# Patient Record
Sex: Male | Born: 1960 | Race: Black or African American | Hispanic: No | Marital: Single | Smoking: Never smoker
Health system: Southern US, Community
[De-identification: ages and names within clinical notes are randomized; demographics above are authoritative.]

## PROBLEM LIST (undated history)

## (undated) DIAGNOSIS — R609 Edema, unspecified: Secondary | ICD-10-CM

## (undated) DIAGNOSIS — I82409 Acute embolism and thrombosis of unspecified deep veins of unspecified lower extremity: Secondary | ICD-10-CM

## (undated) HISTORY — DX: Edema, unspecified: R60.9

## (undated) HISTORY — DX: Acute embolism and thrombosis of unspecified deep veins of unspecified lower extremity: I82.409

---

## 2009-05-10 ENCOUNTER — Ambulatory Visit: Payer: Self-pay | Admitting: Neurology

## 2012-12-28 ENCOUNTER — Encounter (INDEPENDENT_AMBULATORY_CARE_PROVIDER_SITE_OTHER): Payer: BC Managed Care – PPO

## 2012-12-28 DIAGNOSIS — R42 Dizziness and giddiness: Secondary | ICD-10-CM

## 2013-05-23 DIAGNOSIS — M542 Cervicalgia: Secondary | ICD-10-CM | POA: Insufficient documentation

## 2014-01-13 ENCOUNTER — Ambulatory Visit: Payer: Self-pay | Admitting: Gastroenterology

## 2015-08-14 ENCOUNTER — Ambulatory Visit
Admission: RE | Admit: 2015-08-14 | Discharge: 2015-08-14 | Disposition: A | Discharge status: Home / Self Care | Payer: BC Managed Care – PPO | Source: Ambulatory Visit | Attending: Internal Medicine | Admitting: Internal Medicine

## 2015-08-14 ENCOUNTER — Other Ambulatory Visit: Payer: Self-pay | Admitting: Internal Medicine

## 2015-08-14 DIAGNOSIS — M7732 Calcaneal spur, left foot: Secondary | ICD-10-CM | POA: Insufficient documentation

## 2015-08-14 DIAGNOSIS — M79672 Pain in left foot: Secondary | ICD-10-CM | POA: Diagnosis present

## 2016-06-06 DIAGNOSIS — R200 Anesthesia of skin: Secondary | ICD-10-CM | POA: Insufficient documentation

## 2016-06-29 ENCOUNTER — Other Ambulatory Visit: Payer: Self-pay | Admitting: Neurology

## 2016-06-29 DIAGNOSIS — I729 Aneurysm of unspecified site: Secondary | ICD-10-CM | POA: Insufficient documentation

## 2016-06-29 DIAGNOSIS — R4689 Other symptoms and signs involving appearance and behavior: Secondary | ICD-10-CM | POA: Insufficient documentation

## 2016-07-12 ENCOUNTER — Ambulatory Visit
Admission: RE | Admit: 2016-07-12 | Discharge: 2016-07-12 | Disposition: A | Discharge status: Home / Self Care | Payer: BC Managed Care – PPO | Source: Ambulatory Visit | Attending: Neurology | Admitting: Neurology

## 2016-07-12 DIAGNOSIS — R4781 Slurred speech: Secondary | ICD-10-CM | POA: Insufficient documentation

## 2016-07-12 DIAGNOSIS — R202 Paresthesia of skin: Secondary | ICD-10-CM | POA: Insufficient documentation

## 2016-07-12 DIAGNOSIS — I729 Aneurysm of unspecified site: Secondary | ICD-10-CM

## 2017-04-10 ENCOUNTER — Other Ambulatory Visit: Payer: Self-pay

## 2017-04-10 ENCOUNTER — Encounter: Payer: Self-pay | Admitting: *Deleted

## 2017-04-10 ENCOUNTER — Ambulatory Visit
Admission: EM | Admit: 2017-04-10 | Discharge: 2017-04-10 | Disposition: A | Discharge status: Home / Self Care | Payer: BC Managed Care – PPO | Attending: Family Medicine | Admitting: Family Medicine

## 2017-04-10 DIAGNOSIS — L723 Sebaceous cyst: Secondary | ICD-10-CM | POA: Diagnosis not present

## 2017-04-10 MED ORDER — SULFAMETHOXAZOLE-TRIMETHOPRIM 800-160 MG PO TABS: 1.0000 | ORAL_TABLET | Freq: Two times a day (BID) | ORAL | 0 refills | Status: DC

## 2017-04-10 NOTE — ED Triage Notes (Signed)
PAtient started having symptom of abscess on back 1 week ago.

## 2017-04-10 NOTE — ED Provider Notes (Signed)
[facility]    CSN: [id number] Arrival date & time: 04/10/17  1451     History   Chief Complaint Chief Complaint  Patient presents with  . Abscess    HPI Luke Caldwell is a 57 y.o. male.   The history is provided by the patient.  Abscess  Location:  Torso Torso abscess location:  Upper back Size:  2cm Abscess quality: draining, painful (mild), redness (mild) and warmth   Abscess quality: no fluctuance and no itching   Red streaking: no   Duration:  1 week Progression:  Unchanged Pain details:    Quality:  Throbbing   Severity:  Mild   Timing:  Constant   Progression:  Worsening Chronicity:  New Context: not diabetes, not immunosuppression, not injected drug use, not insect bite/sting and not skin injury   Relieved by:  None tried Associated symptoms: fatigue   Associated symptoms: no anorexia, no fever, no headaches, no nausea and no vomiting   Risk factors: prior abscess   Risk factors: no family hx of MRSA and no hx of MRSA     History reviewed. No pertinent past medical history.  There are no active problems to display for this patient.   History reviewed. No pertinent surgical history.     Home Medications    Prior to Admission medications   Medication Sig Start Date End Date Taking? Authorizing Provider  sulfamethoxazole-trimethoprim (BACTRIM DS,SEPTRA DS) 800-160 MG tablet Take 1 tablet by mouth 2 (two) times daily. 04/10/17   Payton Mccallumonty, Ajene Carchi, MD    Family History Family History  Problem Relation Age of Onset  . Cancer Father     Social History Social History   Tobacco Use  . Smoking status: Never Smoker  . Smokeless tobacco: Never Used  Substance Use Topics  . Alcohol use: Yes  . Drug use: No     Allergies   Patient has no known allergies.   Review of Systems Review of Systems  Constitutional: Positive for fatigue. Negative for fever.  Gastrointestinal: Negative for anorexia, nausea and vomiting.  Neurological:  Negative for headaches.     Physical Exam Triage Vital Signs ED Triage Vitals  Enc Vitals Group     BP 04/10/17 1540 (!) 143/91     Pulse Rate 04/10/17 1540 64     Resp 04/10/17 1540 16     Temp 04/10/17 1540 98.3 F (36.8 C)     Temp Source 04/10/17 1540 Oral     SpO2 04/10/17 1540 99 %     Weight 04/10/17 1542 205 lb (93 kg)     Height 04/10/17 1542 5\' 10"  (1.778 m)     Head Circumference --      Peak Flow --      Pain Score 04/10/17 1542 0     Pain Loc --      Pain Edu? --      Excl. in GC? --    No data found.  Updated Vital Signs BP (!) 143/91 (BP Location: Left Arm)   Pulse 64   Temp 98.3 F (36.8 C) (Oral)   Resp 16   Ht 5\' 10"  (1.778 m)   Wt 205 lb (93 kg)   SpO2 99%   BMI 29.41 kg/m   Visual Acuity Right Eye Distance:   Left Eye Distance:   Bilateral Distance:    Right Eye Near:   Left Eye Near:    Bilateral Near:     Physical Exam  Constitutional: He  appears well-developed and well-nourished. No distress.  Skin: He is not diaphoretic.  2-3cm subcutaneous, mildly tender and erythematous soft mass with slight purelnt drainage draining  Nursing note and vitals reviewed.    UC Treatments / Results  Labs (all labs ordered are listed, but only abnormal results are displayed) Labs Reviewed - No data to display  EKG  EKG Interpretation None       Radiology No results found.  Procedures Procedures (including critical care time)  Medications Ordered in UC Medications - No data to display   Initial Impression / Assessment and Plan / UC Course  I have reviewed the triage vital signs and the nursing notes.  Pertinent labs & imaging results that were available during my care of the patient were reviewed by me and considered in my medical decision making (see chart for details).       Final Clinical Impressions(s) / UC Diagnoses   Final diagnoses:  Sebaceous cyst    ED Discharge Orders        Ordered     sulfamethoxazole-trimethoprim (BACTRIM DS,SEPTRA DS) 800-160 MG tablet  2 times daily     04/10/17 1552     1. diagnosis reviewed with patient 2. rx as per orders above; reviewed possible side effects, interactions, risks and benefits  3. Recommend supportive treatment with warm compresses to area 4. F/u with dermatologist for permanent treatment 5. Follow-up prn if symptoms worsen or don't improve   Controlled Substance Prescriptions [place] Controlled Substance Registry consulted? Not Applicable   Payton Mccallum, MD 04/10/17 [phone]

## 2017-08-15 ENCOUNTER — Ambulatory Visit
Admission: RE | Admit: 2017-08-15 | Discharge: 2017-08-15 | Disposition: A | Discharge status: Home / Self Care | Payer: BC Managed Care – PPO | Source: Ambulatory Visit | Attending: Internal Medicine | Admitting: Internal Medicine

## 2017-08-15 ENCOUNTER — Other Ambulatory Visit: Payer: Self-pay | Admitting: Internal Medicine

## 2017-08-15 DIAGNOSIS — I824Z2 Acute embolism and thrombosis of unspecified deep veins of left distal lower extremity: Secondary | ICD-10-CM | POA: Diagnosis not present

## 2017-08-15 DIAGNOSIS — M79604 Pain in right leg: Secondary | ICD-10-CM | POA: Insufficient documentation

## 2017-08-15 DIAGNOSIS — R609 Edema, unspecified: Secondary | ICD-10-CM | POA: Diagnosis present

## 2017-08-25 ENCOUNTER — Ambulatory Visit (INDEPENDENT_AMBULATORY_CARE_PROVIDER_SITE_OTHER): Payer: BC Managed Care – PPO | Admitting: Vascular Surgery

## 2017-08-25 ENCOUNTER — Encounter (INDEPENDENT_AMBULATORY_CARE_PROVIDER_SITE_OTHER): Payer: Self-pay | Admitting: Vascular Surgery

## 2017-08-25 VITALS — BP 116/78 | HR 55 | Resp 13 | Ht 70.0 in | Wt 222.0 lb

## 2017-08-25 DIAGNOSIS — R609 Edema, unspecified: Secondary | ICD-10-CM

## 2017-08-25 DIAGNOSIS — I82409 Acute embolism and thrombosis of unspecified deep veins of unspecified lower extremity: Secondary | ICD-10-CM | POA: Insufficient documentation

## 2017-08-25 DIAGNOSIS — I82441 Acute embolism and thrombosis of right tibial vein: Secondary | ICD-10-CM

## 2017-08-25 NOTE — Patient Instructions (Signed)
Deep Vein Thrombosis Deep vein thrombosis (DVT) is a condition in which a blood clot forms in a deep vein, such as a lower leg, thigh, or arm vein. A clot is blood that has thickened into a gel or solid. This condition is dangerous. It can lead to serious and even life-threatening complications if the clot travels to the lungs and causes a blockage (pulmonary embolism). It can also damage veins in the leg. This can result in leg pain, swelling, discoloration, and sores (post-thrombotic syndrome). What are the causes? This condition may be caused by:  A slowdown of blood flow.  Damage to a vein.  A condition that makes blood clot more easily.  What increases the risk? The following factors may make you more likely to develop this condition:  Being overweight.  Being elderly, especially over age 60.  Sitting or lying down for more than four hours.  Lack of physical activity (sedentary lifestyle).  Being pregnant, giving birth, or having recently given birth.  Taking medicines that contain estrogen.  Smoking.  A history of any of the following: ? Blood clots or blood clotting disease. ? Peripheral vascular disease. ? Inflammatory bowel disease. ? Cancer. ? Heart disease. ? Genetic conditions that affect how blood clots. ? Neurological diseases that affect the legs (leg paresis). ? Injury. ? Major or lengthy surgery. ? A central line placed inside a large vein.  What are the signs or symptoms? Symptoms of this condition include:  Swelling, pain, or tenderness in an arm or leg.  Warmth, redness, or discoloration in an arm or leg.  If the clot is in your leg, symptoms may be more noticeable or worse when you stand or walk. Some people do not have any symptoms. How is this diagnosed? This condition is diagnosed with:  A medical history.  A physical exam.  Tests, such as: ? Blood tests. These are done to see how your blood clots. ? Imaging tests. These are done to  check for clots. Tests may include:  Ultrasound.  CT scan.  MRI.  X-ray.  Venogram. For this test, X-rays are taken after a dye is injected into a vein.  How is this treated? Treatment for this condition depends on the cause, your risk for bleeding or developing more clots, and any medical conditions you have. Treatment may include:  Taking blood thinners (also called anticoagulants). These medicines may be taken by mouth, injected under the skin, or injected through an IV tube (catheter). These medicines prevent clots from forming.  Injecting medicine that dissolves blood clots into the affected vein (catheter-directed thrombolysis).  Having surgery. Surgery may be done to: ? Remove the clot. ? Place a filter in a large vein to catch blood clots before they reach the lungs.  Some treatments may be continued for up to six months. Follow these instructions at home: If you are taking an oral blood thinner:  Take the medicine exactly as told by your health care provider. Some blood thinners need to be taken at the same time every day. Do not skip a dose.  Ask your health care provider about what foods and drugs interact with the medicine.  Ask about possible side effects. General instructions  Blood thinners can cause easy bruising and difficulty stopping bleeding. Because of this, if you are taking or were given a blood thinner: ? Hold pressure over cuts for longer than usual. ? Tell your dentist and other health care providers that you are taking blood thinners before   having any procedures that can cause bleeding. ? Avoid contact sports.  Take over-the-counter and prescription medicines only as told by your health care provider.  Return to your normal activities as told by your health care provider. Ask your health care provider what activities are safe for you.  Wear compression stockings if recommended by your health care provider.  Keep all follow-up visits as told by  your health care provider. This is important. How is this prevented? To lower your risk of developing this condition again:  For 30 or more minutes every day, do an activity that: ? Involves moving your arms and legs. ? Increases your heart rate.  When traveling for longer than four hours: ? Exercise your arms and legs every hour. ? Drink plenty of water. ? Avoid drinking alcohol.  Avoid sitting or lying for a long time without moving your legs.  Stay a healthy weight.  If you are a woman who is older than age 35, avoid unnecessary use of medicines that contain estrogen.  Do not use any products that contain nicotine or tobacco, such as cigarettes and e-cigarettes. This is especially important if you take estrogen medicines. If you need help quitting, ask your health care provider.  Contact a health care provider if:  You miss a dose of your blood thinner.  You have nausea, vomiting, or diarrhea that lasts for more than one day.  Your menstrual period is heavier than usual.  You have unusual bruising. Get help right away if:  You have new or increased pain, swelling, or redness in an arm or leg.  You have numbness or tingling in an arm or leg.  You have shortness of breath.  You have chest pain.  You have a rapid or irregular heartbeat.  You feel light-headed or dizzy.  You cough up blood.  There is blood in your vomit, stool, or urine.  You have a serious fall or accident, or you hit your head.  You have a severe headache or confusion.  You have a cut that will not stop bleeding. These symptoms may represent a serious problem that is an emergency. Do not wait to see if the symptoms will go away. Get medical help right away. Call your local emergency services (911 in the [place] Do not drive yourself to the hospital. Summary  DVT is a condition in which a blood clot forms in a deep vein, such as a lower leg, thigh, or arm vein.  Symptoms can include swelling,  warmth, pain, and redness in your leg or arm.  Treatment may include taking blood thinners, injecting medicine that dissolves blood clots,wearing compression stockings, or surgery.  If you are prescribed blood thinners, take them exactly as told. This information is not intended to replace advice given to you by your health care provider. Make sure you discuss any questions you have with your health care provider. Document Released: 01/31/2005 Document Revised: 03/05/2016 Document Reviewed: 03/05/2016 Elsevier Interactive Patient Education  2018 [employer]  

## 2017-08-25 NOTE — Assessment & Plan Note (Signed)
The patient has an acute right calf vein DVT.  We had a good discussion today about the pathophysiology and natural history of DVT.  Given that this is in the tibial vein, it does not require full anticoagulation at this time.  His symptoms have improved some with aspirin therapy.  This will need a follow-up study to evaluate for progression.  I will plan on getting an ultrasound done sometime next week.  We can contact the patient with the results of the ultrasound.  If there has not been progression, 3 months of aspirin would be recommended.  If there has been progression, he may need to be switched to full anticoagulation.

## 2017-08-25 NOTE — Assessment & Plan Note (Signed)
Likely from his DVT.  Compression and elevations as well as ASA therapy

## 2017-08-25 NOTE — Progress Notes (Signed)
Patient ID: Luke Caldwell, male   DOB: August 13, 1960, 57 y.o.   MRN: [id number]  Chief Complaint  Patient presents with  . New Patient (Initial Visit)    Edema and DVT    HPI Luke Caldwell is a 57 y.o. male.  I am asked to see the patient by Dr. Dario GuardianJadali for evaluation of DVT.  The patient reports a couple of weeks of pain and swelling in the right lower extremity.  This is actually a little bit better by the time he saw his primary care physician last week.  There is no clear inciting event or causative factor that started the symptoms.  He had no previous history of DVT or superficial thrombophlebitis.  No known family history of clotting.  No left leg symptoms.  As part of his work-up, he had a DVT study done which showed no evidence of superficial thrombophlebitis but a calf vein DVT was seen.  No proximal leg DVT was identified.  He has had no chest pain or shortness of breath.  His leg is a little bit better than it was last week.  He does still have some swelling in it though.  It is mildly tender.   Past Medical History:  Diagnosis Date  . DVT (deep venous thrombosis) (HCC)   . Edema     No past surgical history on file.  Family History  Problem Relation Age of Onset  . Cancer Father   no bleeding disorders, clotting disorders, or aneurysms  Social History Social History   Tobacco Use  . Smoking status: Never Smoker  . Smokeless tobacco: Never Used  Substance Use Topics  . Alcohol use: Yes  . Drug use: No    No Known Allergies  Current Outpatient Medications  Medication Sig Dispense Refill  . aspirin 325 MG EC tablet Take 325 mg by mouth daily.    Marland Kitchen. ibuprofen (ADVIL,MOTRIN) 200 MG tablet Take 200 mg by mouth every 6 (six) hours as needed.    . Multiple Vitamins-Minerals (ONE DAILY MENS 50+ MULTIVIT PO) Take by mouth.    . gabapentin (NEURONTIN) 100 MG capsule Take by mouth.    . sulfamethoxazole-trimethoprim (BACTRIM DS,SEPTRA DS) 800-160 MG tablet Take 1 tablet by  mouth 2 (two) times daily. (Patient not taking: Reported on 08/25/2017) 20 tablet 0   No current facility-administered medications for this visit.       REVIEW OF SYSTEMS (Negative unless checked)  Constitutional: [] Weight loss  [] Fever  [] Chills Cardiac: [] Chest pain   [] Chest pressure   [] Palpitations   [] Shortness of breath when laying flat   [] Shortness of breath at rest   [] Shortness of breath with exertion. Vascular:  [x] Pain in legs with walking   [x] Pain in legs at rest   [] Pain in legs when laying flat   [] Claudication   [] Pain in feet when walking  [] Pain in feet at rest  [] Pain in feet when laying flat   [x] History of DVT   [] Phlebitis   [x] Swelling in legs   [] Varicose veins   [] Non-healing ulcers Pulmonary:   [] Uses home oxygen   [] Productive cough   [] Hemoptysis   [] Wheeze  [] COPD   [] Asthma Neurologic:  [] Dizziness  [] Blackouts   [] Seizures   [] History of stroke   [] History of TIA  [] Aphasia   [] Temporary blindness   [] Dysphagia   [] Weakness or numbness in arms   [] Weakness or numbness in legs Musculoskeletal:  [] Arthritis   [] Joint swelling   [] Joint pain   []   Low back pain Hematologic:  [] Easy bruising  [] Easy bleeding   [] Hypercoagulable state   [] Anemic  [] Hepatitis Gastrointestinal:  [] Blood in stool   [] Vomiting blood  [] Gastroesophageal reflux/heartburn   [] Abdominal pain Genitourinary:  [] Chronic kidney disease   [] Difficult urination  [] Frequent urination  [] Burning with urination   [] Hematuria Skin:  [] Rashes   [] Ulcers   [] Wounds Psychological:  [] History of anxiety   []  History of major depression.    Physical Exam BP 116/78 (BP Location: Right Arm, Patient Position: Sitting)   Pulse (!) 55   Resp 13   Ht 5\' 10"  (1.778 m)   Wt 222 lb (100.7 kg)   BMI 31.85 kg/m  Gen:  WD/WN, NAD. Appears younger than stated age. Head: [place]/AT, No temporalis wasting.  Ear/Nose/Throat: Hearing grossly intact, nares w/o erythema or drainage, oropharynx w/o Erythema/Exudate Eyes:  Conjunctiva clear, sclera non-icteric  Neck: trachea midline.   Pulmonary:  Good air movement, respirations not labored, no use of accessory muscles Cardiac: RRR, no JVD Vascular:  Vessel Right Left  Radial Palpable Palpable                          PT Palpable Palpable  DP Palpable Palpable   Gastrointestinal: soft, non-tender/non-distended Musculoskeletal: M/S 5/5 throughout.  Extremities without ischemic changes.  No deformity or atrophy. 1+ RLE edema. Neurologic: Sensation grossly intact in extremities.  Symmetrical.  Speech is fluent. Motor exam as listed above. Psychiatric: Judgment intact, Mood & affect appropriate for pt's clinical situation. Dermatologic: No rashes or ulcers noted.  No cellulitis or open wounds.    Radiology US Venous Img Lower Unilateral Right  Addendum Date: 08/16/2017   ADDENDUM REPORT: 08/16/2017 09:24 ADDENDUM: There is an error in reporting laterality in the report. The distal DVT in the intramuscular gastrocnemius veins is within the right calf and not the left calf. The body and impression segments of the report should indicate: Distal lower extremity DVT isolated to the intramuscular gastrocnemius veins of the right calf. Electronically Signed   By: Irish Lack M.D.   On: 08/16/2017 09:24   Result Date: 08/16/2017 CLINICAL DATA:  Right lower extremity pain and edema. EXAM: RIGHT LOWER EXTREMITY VENOUS DOPPLER ULTRASOUND TECHNIQUE: Gray-scale sonography with graded compression, as well as color Doppler and duplex ultrasound were performed to evaluate the lower extremity deep venous systems from the level of the common femoral vein and including the common femoral, femoral, profunda femoral, popliteal and calf veins including the posterior tibial, peroneal and gastrocnemius veins when visible. The superficial great saphenous vein was also interrogated. Spectral Doppler was utilized to evaluate flow at rest and with distal augmentation maneuvers in the  common femoral, femoral and popliteal veins. COMPARISON:  None. FINDINGS: Contralateral Common Femoral Vein: Respiratory phasicity is normal and symmetric with the symptomatic side. No evidence of thrombus. Normal compressibility. Common Femoral Vein: No evidence of thrombus. Normal compressibility, respiratory phasicity and response to augmentation. Saphenofemoral Junction: No evidence of thrombus. Normal compressibility and flow on color Doppler imaging. Profunda Femoral Vein: No evidence of thrombus. Normal compressibility and flow on color Doppler imaging. Femoral Vein: No evidence of thrombus. Normal compressibility, respiratory phasicity and response to augmentation. Popliteal Vein: No evidence of thrombus. Normal compressibility, respiratory phasicity and response to augmentation. Calf Veins: There is evidence of distal DVT in the intramuscular gastrocnemius veins of the left calf. No tibial vein thrombus is identified. Superficial Great Saphenous Vein: No evidence of thrombus. Normal compressibility. Venous  Reflux:  None. Other Findings: No evidence of superficial thrombophlebitis or abnormal fluid collection. IMPRESSION: Distal left lower extremity DVT isolated to the intramuscular gastrocnemius veins of the left calf. Electronically Signed: By: Irish Lack M.D. On: 08/15/2017 18:02    Labs No results found for this or any previous visit (from the past 2160 hour(s)).  Assessment/Plan:  Edema Likely from his DVT.  Compression and elevations as well as ASA therapy  DVT (deep venous thrombosis) (HCC) The patient has an acute right calf vein DVT.  We had a good discussion today about the pathophysiology and natural history of DVT.  Given that this is in the tibial vein, it does not require full anticoagulation at this time.  His symptoms have improved some with aspirin therapy.  This will need a follow-up study to evaluate for progression.  I will plan on getting an ultrasound done sometime next  week.  We can contact the patient with the results of the ultrasound.  If there has not been progression, 3 months of aspirin would be recommended.  If there has been progression, he may need to be switched to full anticoagulation.      Festus Barren 08/25/2017, 9:22 AM   This note was created with Dragon medical transcription system.  Any errors from dictation are unintentional.

## 2017-09-15 ENCOUNTER — Telehealth (INDEPENDENT_AMBULATORY_CARE_PROVIDER_SITE_OTHER): Payer: Self-pay | Admitting: Vascular Surgery

## 2017-09-15 ENCOUNTER — Encounter (INDEPENDENT_AMBULATORY_CARE_PROVIDER_SITE_OTHER): Payer: Self-pay

## 2017-09-18 ENCOUNTER — Encounter (INDEPENDENT_AMBULATORY_CARE_PROVIDER_SITE_OTHER): Payer: Self-pay | Admitting: Vascular Surgery

## 2017-09-25 ENCOUNTER — Ambulatory Visit
Admission: RE | Admit: 2017-09-25 | Discharge: 2017-09-25 | Disposition: A | Discharge status: Home / Self Care | Payer: BC Managed Care – PPO | Source: Ambulatory Visit | Attending: Vascular Surgery | Admitting: Vascular Surgery

## 2017-09-25 DIAGNOSIS — I82441 Acute embolism and thrombosis of right tibial vein: Secondary | ICD-10-CM | POA: Diagnosis not present

## 2017-09-25 DIAGNOSIS — I824Z1 Acute embolism and thrombosis of unspecified deep veins of right distal lower extremity: Secondary | ICD-10-CM | POA: Insufficient documentation

## 2017-09-26 ENCOUNTER — Telehealth (INDEPENDENT_AMBULATORY_CARE_PROVIDER_SITE_OTHER): Payer: Self-pay | Admitting: Nurse Practitioner

## 2017-09-26 NOTE — Telephone Encounter (Signed)
Left message for the patient to return phone call to the office regarding his results of his DVT study at [facility].  Advised CMA that should he call back, he should continue taking aspirin 325 mg for the next 3 months.

## 2017-09-28 ENCOUNTER — Encounter (INDEPENDENT_AMBULATORY_CARE_PROVIDER_SITE_OTHER): Payer: BC Managed Care – PPO

## 2017-09-28 ENCOUNTER — Ambulatory Visit (INDEPENDENT_AMBULATORY_CARE_PROVIDER_SITE_OTHER): Payer: BC Managed Care – PPO | Admitting: Vascular Surgery

## 2017-11-10 ENCOUNTER — Encounter (INDEPENDENT_AMBULATORY_CARE_PROVIDER_SITE_OTHER): Payer: Self-pay | Admitting: Nurse Practitioner

## 2017-11-10 ENCOUNTER — Ambulatory Visit (INDEPENDENT_AMBULATORY_CARE_PROVIDER_SITE_OTHER): Payer: BC Managed Care – PPO

## 2017-11-10 ENCOUNTER — Ambulatory Visit (INDEPENDENT_AMBULATORY_CARE_PROVIDER_SITE_OTHER): Payer: BC Managed Care – PPO | Admitting: Nurse Practitioner

## 2017-11-10 ENCOUNTER — Other Ambulatory Visit (INDEPENDENT_AMBULATORY_CARE_PROVIDER_SITE_OTHER): Payer: Self-pay | Admitting: Vascular Surgery

## 2017-11-10 VITALS — BP 117/75 | HR 61 | Resp 16 | Ht 70.0 in | Wt 220.0 lb

## 2017-11-10 DIAGNOSIS — I82511 Chronic embolism and thrombosis of right femoral vein: Secondary | ICD-10-CM

## 2017-11-10 DIAGNOSIS — R609 Edema, unspecified: Secondary | ICD-10-CM

## 2017-11-10 DIAGNOSIS — I82441 Acute embolism and thrombosis of right tibial vein: Secondary | ICD-10-CM

## 2017-11-13 ENCOUNTER — Encounter (INDEPENDENT_AMBULATORY_CARE_PROVIDER_SITE_OTHER): Payer: Self-pay | Admitting: Nurse Practitioner

## 2017-11-13 NOTE — Progress Notes (Signed)
Subjective:    Patient ID: Luke Caldwell, male    DOB: [birth year], 57 y.o.   MRN: [id number] Chief Complaint  Patient presents with  . Follow-up    35month right dvt study    HPI  Luke Caldwell is a 57 y.o. male presenting to the office for evaluation of DVT.  DVT was identified at [facility] by Duplex ultrasound.  The initial symptoms were pain and swelling in the lower extremity.  The patient notes the leg continues to be very with dependency at times and swells quite a bite.  Symptoms are much better with elevation.  The patient notes minimal edema in the morning which steadily worsens throughout the day.    The patient has not been using compression therapy at this point.  No SOB or pleuritic chest pains.  No cough or hemoptysis.  No blood per rectum or blood in any sputum.  No excessive bruising per the patient.  Patient is continued to take aspirin 325 daily  Today the patient underwent a DVT study of his right lower extremity.  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  There are findings consistent with a chronic deep vein thrombosis.  These findings are unchanged compared to previous examination.  There is no evidence of superficial venous thrombosis.  It is partially compressible.      Review of Systems   Review of Systems: Negative Unless Checked Constitutional: [] Weight loss  [] Fever  [] Chills Cardiac: [] Chest pain   ? Atrial Fibrillation  [] Palpitations   [] Shortness of breath when laying flat   [] Shortness of breath with exertion. Vascular:  [] Pain in legs with walking   [] Pain in legs with standing  [x] History of DVT   [] Phlebitis   [] Swelling in legs   [] Varicose veins   [] Non-healing ulcers Pulmonary:   [] Uses home oxygen   [] Productive cough   [] Hemoptysis   [] Wheeze  [] COPD   [] Asthma Neurologic:  [] Dizziness   [] Seizures   [] History of stroke   [] History of TIA  [] Aphasia   [] Vissual changes   [] Weakness or numbness in arm   [] Weakness or numbness in  leg Musculoskeletal:   [] Joint swelling   [] Joint pain   [] Low back pain  ? History of Knee Replacement Hematologic:  [] Easy bruising  [] Easy bleeding   [] Hypercoagulable state   [] Anemic Gastrointestinal:  [] Diarrhea   [] Vomiting  [] Gastroesophageal reflux/heartburn   [] Difficulty swallowing. Genitourinary:  [] Chronic kidney disease   [] Difficult urination  [] Anuric   [] Blood in urine Skin:  [] Rashes   [] Ulcers  Psychological:  [] History of anxiety   []  History of major depression  ? Memory Difficulties     Objective:   Physical Exam  BP 117/75 (BP Location: Right Arm)   Pulse 61   Resp 16   Ht 5\' 10"  (1.778 m)   Wt 220 lb (99.8 kg)   BMI 31.57 kg/m   Past Medical History:  Diagnosis Date  . DVT (deep venous thrombosis) (HCC)   . Edema      Gen: WD/WN, NAD Head: Enhaut/AT, No temporalis wasting.  Ear/Nose/Throat: Hearing grossly intact, nares w/o erythema or drainage Eyes: PER, EOMI, sclera nonicteric.  Neck: Supple, no masses.  No JVD.  Pulmonary:  Good air movement, no use of accessory muscles.  Cardiac: RRR Vascular:  Pain on palpation of the right calf.  1+ soft edema. Vessel Right Left  Radial Palpable Palpable  Dorsalis Pedis Palpable Palpable  Posterior Tibial Palpable Palpable  Gastrointestinal: soft, non-distended. No guarding/no peritoneal signs.  Musculoskeletal: M/S 5/5 throughout.  No deformity or atrophy.  Neurologic: Pain and light touch intact in extremities.  Symmetrical.  Speech is fluent. Motor exam as listed above. Psychiatric: Judgment intact, Mood & affect appropriate for pt's clinical situation. Dermatologic: No Venous rashes. No Ulcers Noted.  No changes consistent with cellulitis. Lymph : No Cervical lymphadenopathy, no lichenification or skin changes of chronic lymphedema.   Social History   Socioeconomic History  . Marital status: Single    Spouse name: Not on file  . Number of children: Not on file  . Years of education: Not on file  .  Highest education level: Not on file  Occupational History  . Not on file  Social Needs  . Financial resource strain: Not on file  . Food insecurity:    Worry: Not on file    Inability: Not on file  . Transportation needs:    Medical: Not on file    Non-medical: Not on file  Tobacco Use  . Smoking status: Never Smoker  . Smokeless tobacco: Never Used  Substance and Sexual Activity  . Alcohol use: Yes  . Drug use: No  . Sexual activity: Not on file  Lifestyle  . Physical activity:    Days per week: Not on file    Minutes per session: Not on file  . Stress: Not on file  Relationships  . Social connections:    Talks on phone: Not on file    Gets together: Not on file    Attends religious service: Not on file    Active member of club or organization: Not on file    Attends meetings of clubs or organizations: Not on file    Relationship status: Not on file  . Intimate partner violence:    Fear of current or ex partner: Not on file    Emotionally abused: Not on file    Physically abused: Not on file    Forced sexual activity: Not on file  Other Topics Concern  . Not on file  Social History Narrative  . Not on file    History reviewed. No pertinent surgical history.  Family History  Problem Relation Age of Onset  . Cancer Father     No Known Allergies     Assessment & Plan:   1. Acute deep vein thrombosis (DVT) of tibial vein of right lower extremity (HCC)  Today the patient underwent a DVT study of his right lower extremity.  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  There are findings consistent with a chronic deep vein thrombosis.  These findings are unchanged compared to previous examination.  There is no evidence of superficial venous thrombosis.  It is partially compressible.   The patient will continue a daily aspirin of 325 for the next 3 months, and then transition to a daily 1 mg aspirin, for now as there have not been any problems or  complications at this point.      2. Edema, unspecified type  I have had a long discussion with the patient regarding DVT and post phlebitic changes such as swelling and why it  causes symptoms such as pain.  The patient will wear graduated compression stockings class 1 (20-30 mmHg) on a daily basis a prescription was given. The patient will  beginning wearing the stockings first thing in the morning and removing them in the evening. The patient is instructed specifically not to  sleep in the stockings.  In addition, behavioral modification including elevation during the day and avoidance of prolonged dependency will be initiated.    The patient will follow-up as needed for continued pain or if there is a recurrent DVT.   Current Outpatient Medications on File Prior to Visit  Medication Sig Dispense Refill  . aspirin 325 MG EC tablet Take 325 mg by mouth daily.    Marland Kitchen ibuprofen (ADVIL,MOTRIN) 200 MG tablet Take 200 mg by mouth every 6 (six) hours as needed.    . Multiple Vitamins-Minerals (ONE DAILY MENS 50+ MULTIVIT PO) Take by mouth.    . gabapentin (NEURONTIN) 100 MG capsule Take by mouth.    . sulfamethoxazole-trimethoprim (BACTRIM DS,SEPTRA DS) 800-160 MG tablet Take 1 tablet by mouth 2 (two) times daily. (Patient not taking: Reported on 08/25/2017) 20 tablet 0   No current facility-administered medications on file prior to visit.     There are no Patient Instructions on file for this visit. No follow-ups on file.   Georgiana Spinner, NP

## 2017-12-12 ENCOUNTER — Other Ambulatory Visit: Payer: Self-pay | Admitting: Neurology

## 2017-12-12 DIAGNOSIS — R200 Anesthesia of skin: Secondary | ICD-10-CM

## 2017-12-12 DIAGNOSIS — R202 Paresthesia of skin: Principal | ICD-10-CM

## 2017-12-14 DIAGNOSIS — R42 Dizziness and giddiness: Secondary | ICD-10-CM | POA: Insufficient documentation

## 2017-12-14 DIAGNOSIS — R200 Anesthesia of skin: Secondary | ICD-10-CM | POA: Insufficient documentation

## 2017-12-20 ENCOUNTER — Encounter (INDEPENDENT_AMBULATORY_CARE_PROVIDER_SITE_OTHER): Payer: BC Managed Care – PPO

## 2017-12-20 ENCOUNTER — Ambulatory Visit (INDEPENDENT_AMBULATORY_CARE_PROVIDER_SITE_OTHER): Payer: BC Managed Care – PPO | Admitting: Vascular Surgery

## 2018-01-02 ENCOUNTER — Ambulatory Visit
Admission: RE | Admit: 2018-01-02 | Discharge: 2018-01-02 | Disposition: A | Discharge status: Home / Self Care | Payer: BC Managed Care – PPO | Source: Ambulatory Visit | Attending: Neurology | Admitting: Neurology

## 2018-01-02 DIAGNOSIS — R42 Dizziness and giddiness: Secondary | ICD-10-CM | POA: Diagnosis not present

## 2018-01-02 DIAGNOSIS — R202 Paresthesia of skin: Secondary | ICD-10-CM | POA: Diagnosis not present

## 2018-01-02 DIAGNOSIS — R200 Anesthesia of skin: Secondary | ICD-10-CM | POA: Insufficient documentation

## 2018-03-20 ENCOUNTER — Ambulatory Visit (INDEPENDENT_AMBULATORY_CARE_PROVIDER_SITE_OTHER): Payer: BC Managed Care – PPO | Admitting: Vascular Surgery

## 2018-03-20 ENCOUNTER — Encounter (INDEPENDENT_AMBULATORY_CARE_PROVIDER_SITE_OTHER): Payer: BC Managed Care – PPO

## 2018-03-20 ENCOUNTER — Encounter

## 2018-03-25 ENCOUNTER — Ambulatory Visit
Admission: EM | Admit: 2018-03-25 | Discharge: 2018-03-25 | Disposition: A | Discharge status: Home / Self Care | Payer: BC Managed Care – PPO | Attending: Family Medicine | Admitting: Family Medicine

## 2018-03-25 DIAGNOSIS — B9789 Other viral agents as the cause of diseases classified elsewhere: Secondary | ICD-10-CM | POA: Diagnosis not present

## 2018-03-25 DIAGNOSIS — J069 Acute upper respiratory infection, unspecified: Secondary | ICD-10-CM | POA: Diagnosis not present

## 2018-03-25 LAB — RAPID INFLUENZA A&B ANTIGENS
Influenza A (ARMC): NEGATIVE
Influenza B (ARMC): NEGATIVE

## 2018-03-25 NOTE — ED Provider Notes (Signed)
[facility]    CSN: [id number] Arrival date & time: 03/25/18  0910     History   Chief Complaint Chief Complaint  Patient presents with  . Nasal Congestion    HPI Luke Caldwell is a 58 y.o. male.   The history is provided by the patient.  URI  Presenting symptoms: congestion, cough and rhinorrhea   Severity:  Moderate Onset quality:  Sudden Duration:  3 days Timing:  Constant Progression:  Unchanged Chronicity:  New Relieved by:  OTC medications Associated symptoms: no sinus pain and no wheezing   Risk factors: sick contacts     Past Medical History:  Diagnosis Date  . DVT (deep venous thrombosis) (HCC)   . Edema     Patient Active Problem List   Diagnosis Date Noted  . Edema 08/25/2017  . DVT (deep venous thrombosis) (HCC) 08/25/2017    History reviewed. No pertinent surgical history.     Home Medications    Prior to Admission medications   Medication Sig Start Date End Date Taking? Authorizing Provider  aspirin 325 MG EC tablet Take 325 mg by mouth daily.    [provider]  gabapentin (NEURONTIN) 100 MG capsule Take by mouth. 06/27/16   [provider]  ibuprofen (ADVIL,MOTRIN) 200 MG tablet Take 200 mg by mouth every 6 (six) hours as needed.    [provider]  Multiple Vitamins-Minerals (ONE DAILY MENS 50+ MULTIVIT PO) Take by mouth.    [provider]  sulfamethoxazole-trimethoprim (BACTRIM DS,SEPTRA DS) 800-160 MG tablet Take 1 tablet by mouth 2 (two) times daily. Patient not taking: Reported on 08/25/2017 04/10/17   Payton Mccallumonty, Jeorge Reister, MD    Family History Family History  Problem Relation Age of Onset  . Cancer Father     Social History Social History   Tobacco Use  . Smoking status: Never Smoker  . Smokeless tobacco: Never Used  Substance Use Topics  . Alcohol use: Yes  . Drug use: No     Allergies   Patient has no known allergies.   Review of Systems Review of Systems  HENT:  Positive for congestion and rhinorrhea. Negative for sinus pain.   Respiratory: Positive for cough. Negative for wheezing.      Physical Exam Triage Vital Signs ED Triage Vitals  Enc Vitals Group     BP 03/25/18 0927 138/86     Pulse Rate 03/25/18 0927 (!) 101     Resp 03/25/18 0927 18     Temp 03/25/18 0927 (!) 100.8 F (38.2 C)     Temp Source 03/25/18 0927 Oral     SpO2 03/25/18 0927 95 %     Weight 03/25/18 0928 210 lb (95.3 kg)     Height 03/25/18 0928 5\' 10"  (1.778 m)     Head Circumference --      Peak Flow --      Pain Score 03/25/18 0928 0     Pain Loc --      Pain Edu? --      Excl. in GC? --    No data found.  Updated Vital Signs BP 138/86 (BP Location: Left Arm)   Pulse (!) 101   Temp (!) 100.8 F (38.2 C) (Oral)   Resp 18   Ht 5\' 10"  (1.778 m)   Wt 95.3 kg   SpO2 95%   BMI 30.13 kg/m   Visual Acuity Right Eye Distance:   Left Eye Distance:   Bilateral Distance:  Right Eye Near:   Left Eye Near:    Bilateral Near:     Physical Exam Vitals signs and nursing note reviewed.  Constitutional:      General: He is not in acute distress.    Appearance: He is well-developed. He is not diaphoretic.  HENT:     Head: Normocephalic and atraumatic.     Right Ear: Tympanic membrane, ear canal and external ear normal.     Left Ear: Tympanic membrane, ear canal and external ear normal.     Nose: Nose normal.     Mouth/Throat:     Pharynx: Uvula midline. No oropharyngeal exudate.     Tonsils: No tonsillar abscesses.  Eyes:     General: No scleral icterus.       Right eye: No discharge.        Left eye: No discharge.     Conjunctiva/sclera: Conjunctivae normal.     Pupils: Pupils are equal, round, and reactive to light.  Neck:     Musculoskeletal: Normal range of motion and neck supple.     Thyroid: No thyromegaly.     Trachea: No tracheal deviation.  Cardiovascular:     Rate and Rhythm: Normal rate and regular rhythm.     Heart sounds: Normal heart  sounds.  Pulmonary:     Effort: Pulmonary effort is normal. No respiratory distress.     Breath sounds: Normal breath sounds. No stridor. No wheezing or rales.  Chest:     Chest wall: No tenderness.  Lymphadenopathy:     Cervical: No cervical adenopathy.  Skin:    General: Skin is warm and dry.     Findings: No rash.  Neurological:     Mental Status: He is alert.      UC Treatments / Results  Labs (all labs ordered are listed, but only abnormal results are displayed) Labs Reviewed  RAPID INFLUENZA A&B ANTIGENS ([facility] ONLY)    EKG None  Radiology No results found.  Procedures Procedures (including critical care time)  Medications Ordered in UC Medications - No data to display  Initial Impression / Assessment and Plan / UC Course  I have reviewed the triage vital signs and the nursing notes.  Pertinent labs & imaging results that were available during my care of the patient were reviewed by me and considered in my medical decision making (see chart for details).      Final Clinical Impressions(s) / UC Diagnoses   Final diagnoses:  Viral URI with cough    ED Prescriptions    None     1. Lab results and diagnosis reviewed with patient 2. Recommend supportive treatment with rest, fluids, otc meds 3. Follow-up prn if symptoms worsen or don't improve    Controlled Substance Prescriptions [place] Controlled Substance Registry consulted? Not Applicable   Payton Mccallum, MD 03/25/18 [phone]

## 2018-03-25 NOTE — ED Triage Notes (Signed)
Pt here for sore throat, nasal congestion, runny nose and has been going on since Thursday and wasn't getting any better. Also productive cough as well. Has been taking Theraflu without relief.

## 2018-10-03 ENCOUNTER — Other Ambulatory Visit: Payer: Self-pay

## 2018-10-03 DIAGNOSIS — Z20822 Contact with and (suspected) exposure to covid-19: Secondary | ICD-10-CM

## 2018-10-04 LAB — NOVEL CORONAVIRUS, NAA: SARS-CoV-2, NAA: NOT DETECTED

## 2018-12-17 IMAGING — US US EXTREM LOW VENOUS*R*
1 series · 14 of 24 positions shown · non-contrast
Comparison: 08/15/2017

CLINICAL DATA: Right DVT, follow-up

EXAM:
RIGHT LOWER EXTREMITY VENOUS DOPPLER ULTRASOUND
TECHNIQUE: Gray-scale sonography with compression, as well as color and duplex
ultrasound, were performed to evaluate the deep venous system from
the level of the common femoral vein through the popliteal and
proximal calf veins.

[Series 1: us extrem low venous*right* · 14 of 39 slices shown]
[im 1/39]
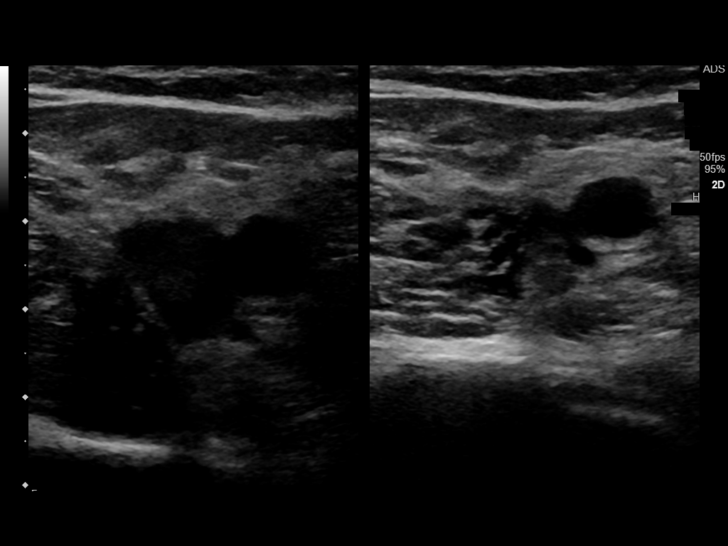
[im 4/39]
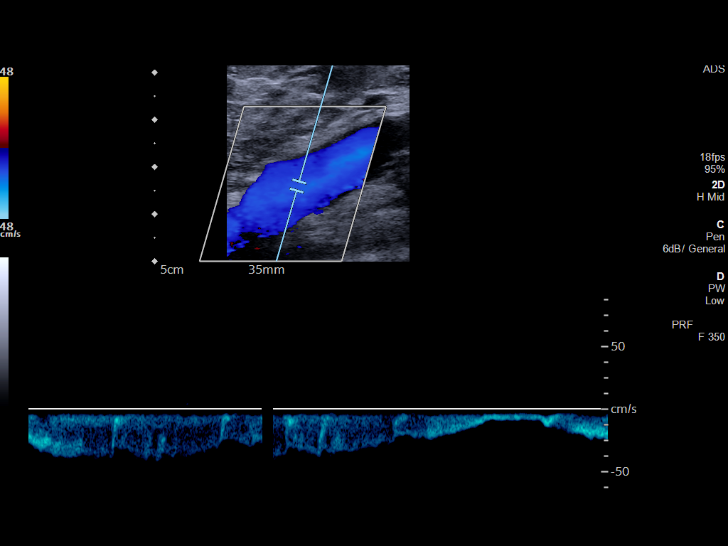
[im 7/39]
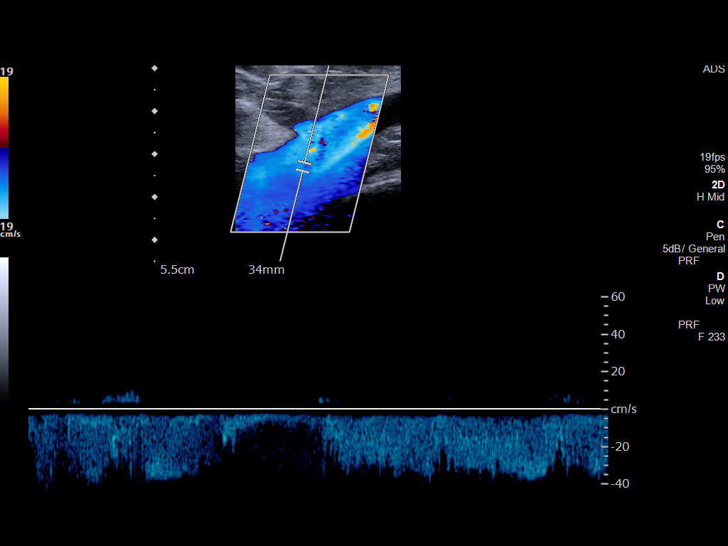
[im 10/39]
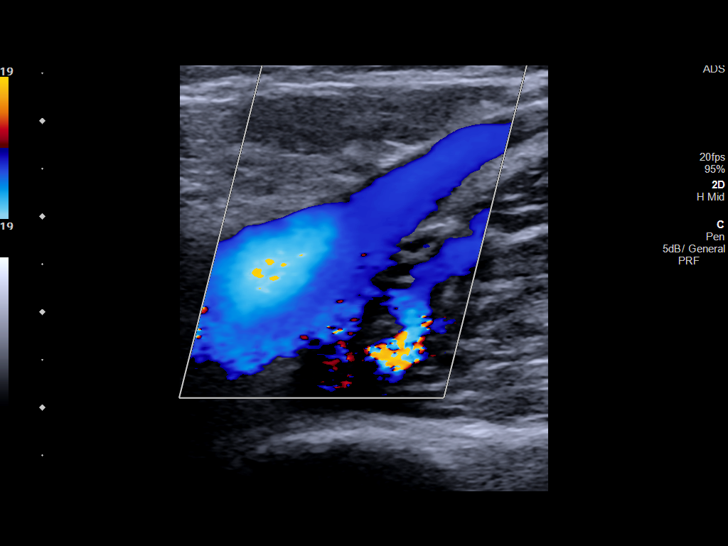
[im 12/39]
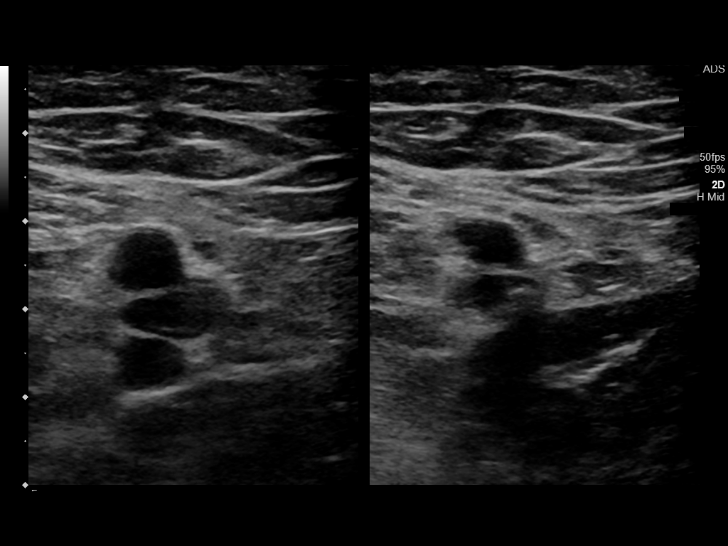
[im 15/39]
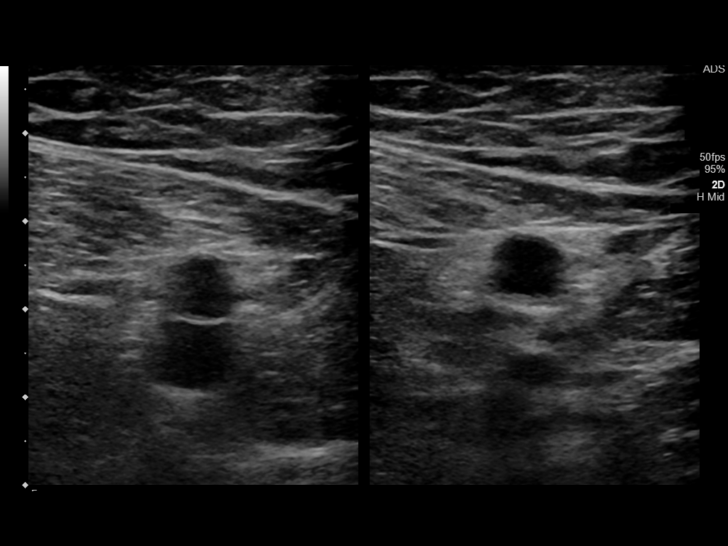
[im 19/39]
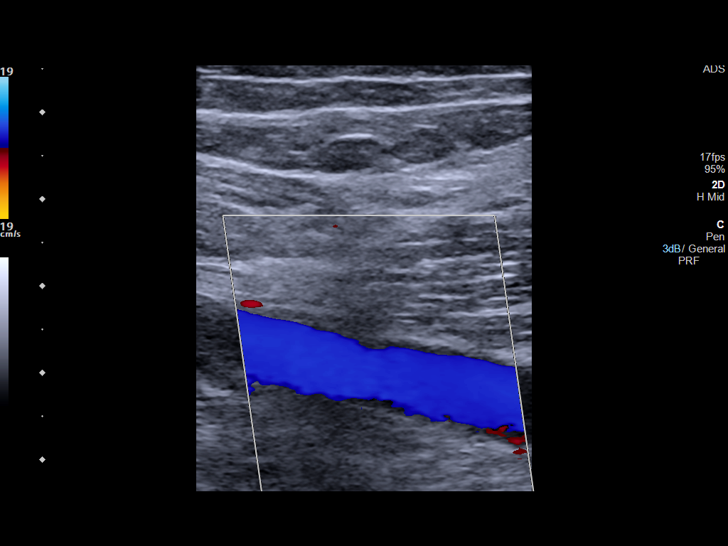
[im 20/39]
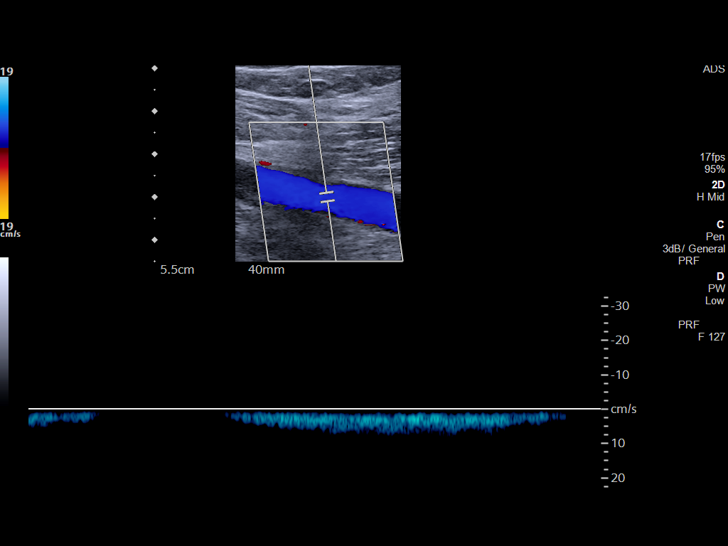
[im 24/39]
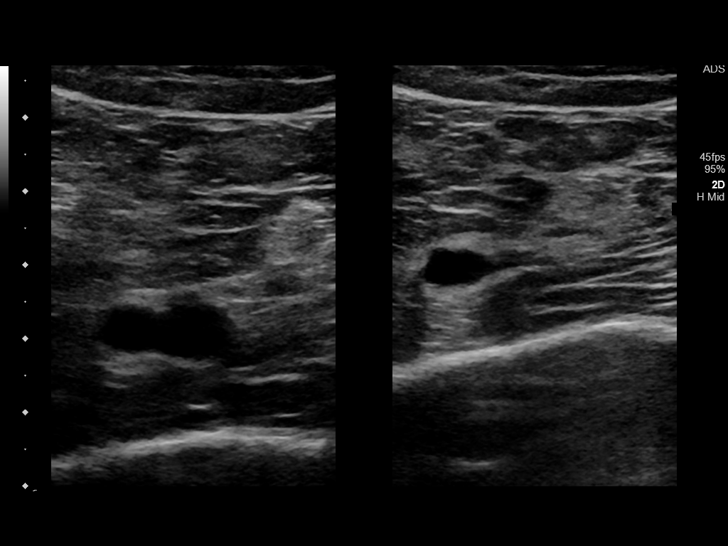
[im 27/39]
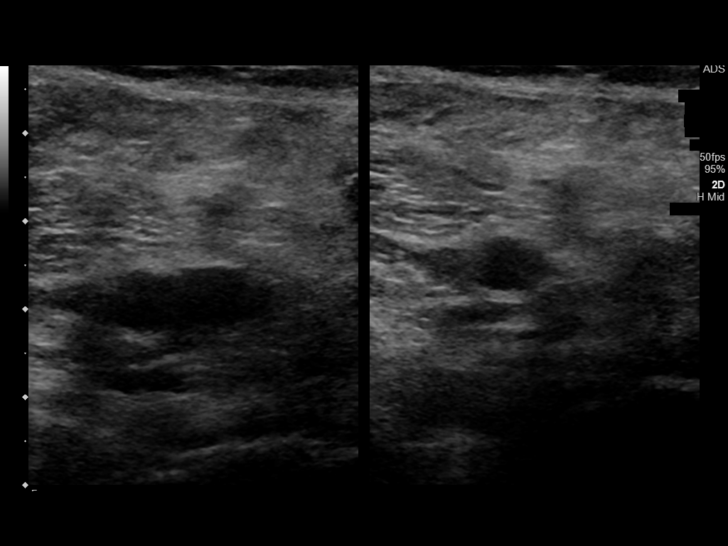
[im 30/39]
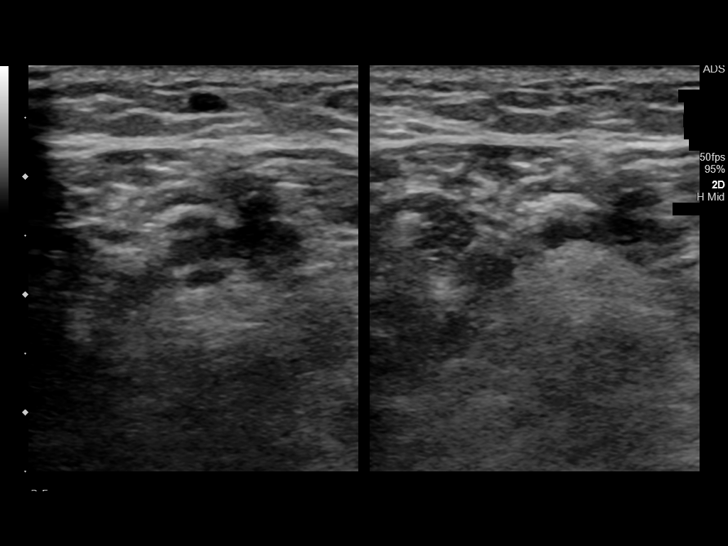
[im 32/39]
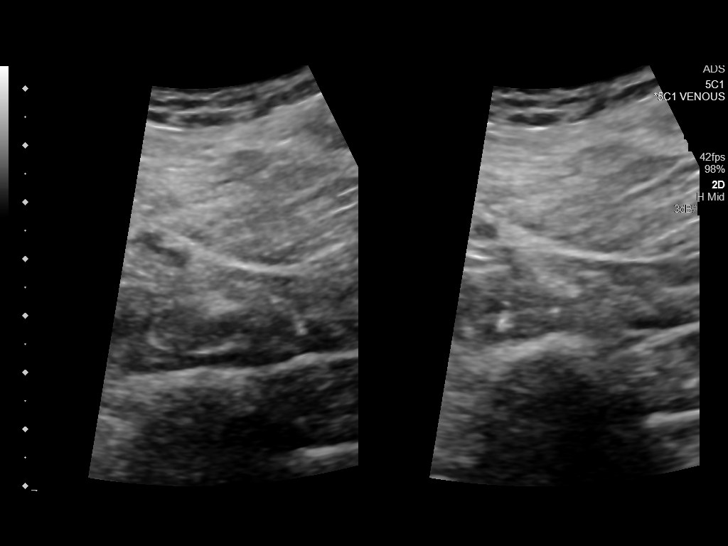
[im 35/39]
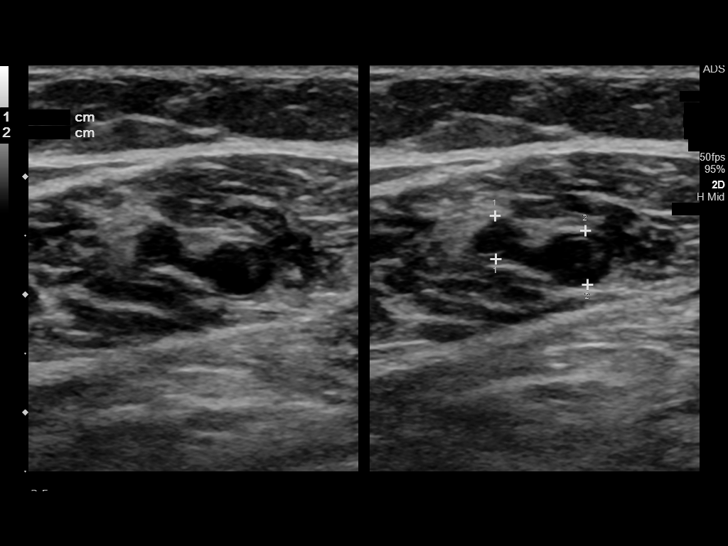
[im 39/39]
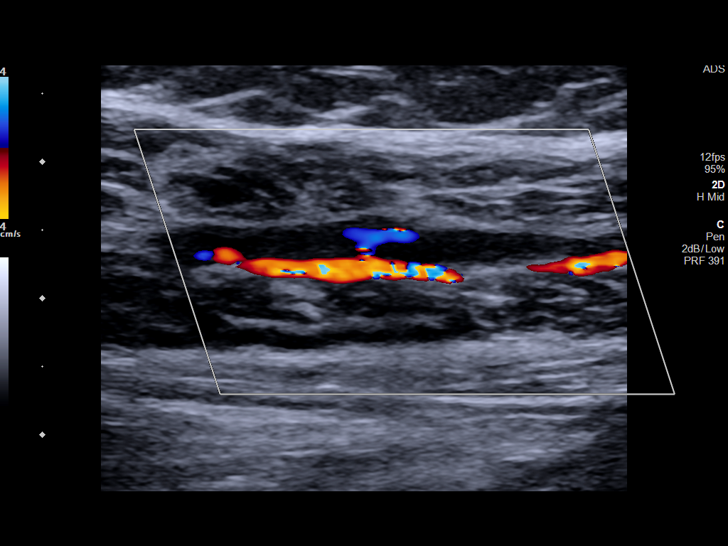

[14 of 24 positions shown; findings below may reference images not displayed]

FINDINGS: Normal compressibility of the common femoral, superficial femoral,
and popliteal veins, as well as the proximal posterior tibial and
peroneal veins. Noncompressible thrombus in right calf intramuscular
gastrocnemius vein.
IMPRESSION: 1. Stable appearance of isolated right gastrocnemius (calf) DVT
without evidence of central propagation.

## 2019-01-14 ENCOUNTER — Other Ambulatory Visit: Payer: Self-pay

## 2019-01-14 DIAGNOSIS — Z20822 Contact with and (suspected) exposure to covid-19: Secondary | ICD-10-CM

## 2019-01-16 LAB — NOVEL CORONAVIRUS, NAA: SARS-CoV-2, NAA: NOT DETECTED

## 2019-03-06 ENCOUNTER — Ambulatory Visit: Payer: BC Managed Care – PPO | Attending: Internal Medicine

## 2019-03-06 DIAGNOSIS — Z20822 Contact with and (suspected) exposure to covid-19: Secondary | ICD-10-CM

## 2019-03-07 LAB — NOVEL CORONAVIRUS, NAA: SARS-CoV-2, NAA: NOT DETECTED

## 2020-11-23 ENCOUNTER — Other Ambulatory Visit: Payer: Self-pay | Admitting: Internal Medicine

## 2020-11-23 ENCOUNTER — Ambulatory Visit
Admission: RE | Admit: 2020-11-23 | Discharge: 2020-11-23 | Disposition: A | Discharge status: Home / Self Care | Payer: BC Managed Care – PPO | Source: Ambulatory Visit | Attending: Internal Medicine | Admitting: Internal Medicine

## 2020-11-23 ENCOUNTER — Other Ambulatory Visit: Payer: Self-pay

## 2020-11-23 ENCOUNTER — Ambulatory Visit
Admission: RE | Admit: 2020-11-23 | Discharge: 2020-11-23 | Disposition: A | Discharge status: Home / Self Care | Payer: BC Managed Care – PPO | Attending: Internal Medicine | Admitting: Internal Medicine

## 2020-11-23 DIAGNOSIS — M159 Polyosteoarthritis, unspecified: Secondary | ICD-10-CM

## 2020-12-25 ENCOUNTER — Encounter (HOSPITAL_COMMUNITY): Payer: Self-pay | Admitting: Radiology

## 2022-06-07 ENCOUNTER — Encounter: Payer: Self-pay | Admitting: Internal Medicine

## 2022-06-08 ENCOUNTER — Encounter: Payer: Self-pay | Admitting: Internal Medicine

## 2022-06-15 ENCOUNTER — Encounter: Payer: Self-pay | Admitting: *Deleted

## 2023-09-20 ENCOUNTER — Ambulatory Visit
Admission: EM | Admit: 2023-09-20 | Discharge: 2023-09-20 | Disposition: A | Discharge status: Home / Self Care | Attending: Family Medicine | Admitting: Family Medicine

## 2023-09-20 DIAGNOSIS — R21 Rash and other nonspecific skin eruption: Secondary | ICD-10-CM | POA: Diagnosis not present

## 2023-09-20 MED ORDER — TRIAMCINOLONE ACETONIDE 0.5 % EX OINT: 1.0000 | TOPICAL_OINTMENT | Freq: Two times a day (BID) | CUTANEOUS | 0 refills | Status: AC

## 2023-09-20 MED ORDER — IVERMECTIN 3 MG PO TABS: 200.0000 ug/kg | ORAL_TABLET | ORAL | 0 refills | Status: AC

## 2023-09-20 MED ORDER — PREDNISONE 10 MG (21) PO TBPK: ORAL_TABLET | Freq: Every day | ORAL | 0 refills | Status: DC

## 2023-09-20 NOTE — Discharge Instructions (Signed)
 Stop by the pharmacy to pick up your prescriptions.  Follow up with your primary care provider or return to the urgent care, if not improving.

## 2023-09-20 NOTE — ED Triage Notes (Addendum)
 Patient states that he woke up 6 days ago with bumps on his arms. Thinks they're bug bites. Using alcohol and popping them.

## 2023-09-20 NOTE — ED Provider Notes (Signed)
 [facility]    CSN: [id number] Arrival date & time: 09/20/23  0951      History   Chief Complaint Chief Complaint  Patient presents with   Insect Bite    HPI Eulas Garcialopez is a 63 y.o. male.   HPI  [place] presents for 6 days of bumps on his bilateral arms  and now on his back.  The rash started after he woke up. He has been popping the blisters. Did some yard work but doesn't think this is related.  No one else sleeps in the bed with him. Has red itchy rash. Tried OTC creams and ointments without relief.  He is around someone else's cat.   There is been no new products including soaps and detergents.  No eye irritation, sore throat, difficulty breathing, nausea, vomiting or diarrhea.  Denies belly pain, joint pain and fever.  There has been no medication changes or new supplements.  Denies any new foods or drinks.     Past Medical History:  Diagnosis Date   DVT (deep venous thrombosis) (HCC)    Edema     Patient Active Problem List   Diagnosis Date Noted   Numbness and tingling of left side of face 12/14/2017   Vertigo 12/14/2017   Edema 08/25/2017   DVT (deep venous thrombosis) (HCC) 08/25/2017   Aneurysm (HCC) 06/29/2016   Spell of abnormal behavior 06/29/2016   Numbness 06/06/2016   Neck pain 05/23/2013    History reviewed. No pertinent surgical history.     Home Medications    Prior to Admission medications   Medication Sig Start Date End Date Taking? Authorizing Provider  ivermectin  (STROMECTOL ) 3 MG TABS tablet Take 7 tablets (21,000 mcg total) by mouth every 14 (fourteen) days for 2 doses. 09/20/23 10/05/23 Yes Dail Lerew, DO  predniSONE  (STERAPRED UNI-PAK 21 TAB) 10 MG (21) TBPK tablet Take by mouth daily. Take 6 tabs by mouth daily for 1, then 5 tabs for 1 day, then 4 tabs for 1 day, then 3 tabs for 1 day, then 2 tabs for 1 day, then 1 tab for 1 day. 09/20/23  Yes Harvy Riera, DO  triamcinolone  ointment (KENALOG ) 0.5 % Apply 1  Application topically 2 (two) times daily. 09/20/23  Yes Filicia Scogin, DO  aspirin 325 MG EC tablet Take 325 mg by mouth daily.    [provider]  gabapentin (NEURONTIN) 100 MG capsule Take by mouth. 06/27/16   [provider]  ibuprofen (ADVIL,MOTRIN) 200 MG tablet Take 200 mg by mouth every 6 (six) hours as needed.    [provider]  Multiple Vitamins-Minerals (ONE DAILY MENS 50+ MULTIVIT PO) Take by mouth.    [provider]  sulfamethoxazole -trimethoprim  (BACTRIM  DS,SEPTRA  DS) 800-160 MG tablet Take 1 tablet by mouth 2 (two) times daily. Patient not taking: Reported on 08/25/2017 04/10/17   Servando Hire, MD    Family History Family History  Problem Relation Age of Onset   Cancer Father     Social History Social History   Tobacco Use   Smoking status: Never   Smokeless tobacco: Never  Vaping Use   Vaping status: Never Used  Substance Use Topics   Alcohol use: Yes   Drug use: No     Allergies   Patient has no known allergies.   Review of Systems Review of Systems :negative unless otherwise stated in HPI.      Physical Exam Triage Vital Signs ED Triage Vitals  Encounter Vitals Group  BP 09/20/23 1003 (!) 157/89     Girls Systolic BP Percentile --      Girls Diastolic BP Percentile --      Boys Systolic BP Percentile --      Boys Diastolic BP Percentile --      Pulse Rate 09/20/23 1003 (!) 58     Resp 09/20/23 1003 19     Temp 09/20/23 1003 98 F (36.7 C)     Temp Source 09/20/23 1003 Oral     SpO2 09/20/23 1003 96 %     Weight --      Height --      Head Circumference --      Peak Flow --      Pain Score 09/20/23 1002 0     Pain Loc --      Pain Education --      Exclude from Growth Chart --    No data found.  Updated Vital Signs BP (!) 157/89 (BP Location: Right Arm)   Pulse (!) 58   Temp 98 F (36.7 C) (Oral)   Resp 19   Wt 101.6 kg   SpO2 96%   BMI 32.14 kg/m   Visual Acuity Right Eye Distance:    Left Eye Distance:   Bilateral Distance:    Right Eye Near:   Left Eye Near:    Bilateral Near:     Physical Exam  GEN: alert, well appearing male, in no acute distress  EYES: no scleral injection or discharge HENT: Moist mucous membranes, no oropharyngeal ulcers or petechiae CV: regular rate RESP: no increased work of breathing MSK: no extremity edema  NEURO: alert, moves all extremities appropriately SKIN: warm and dry; blistering erythematous vesicles and papules in some areas linear pattern as seen below is located on bilateral UE and posterior left shoulder         UC Treatments / Results  Labs (all labs ordered are listed, but only abnormal results are displayed) Labs Reviewed - No data to display  EKG   Radiology No results found.  Procedures Procedures (including critical care time)  Medications Ordered in UC Medications - No data to display  Initial Impression / Assessment and Plan / UC Course  I have reviewed the triage vital signs and the nursing notes.  Pertinent labs & imaging results that were available during my care of the patient were reviewed by me and considered in my medical decision making (see chart for details).     Patient is a 63 y.o. male who presents for erythematous vesicles and papules on bilateral upper extremities and left posterior shoulder.  Overall, patient is well-appearing and well-hydrated.  Vital signs stable.  Soul is afebrile.  Exam concerning for scabies vs contact dermatitis.  Treat with weight based ivermectin  and steroid ointment. Pt to start prednisone  if rash not improving in 1 week.  No sign of infection to suggest antibiotics or antifungals at this time.  Not likely viral exanthem or Shingles .    Reviewed expectations regarding course of current medical issues.  All questions asked were answered.  Outlined signs and symptoms indicating need for more acute intervention. Patient verbalized understanding. After Visit  Summary given.   Final Clinical Impressions(s) / UC Diagnoses   Final diagnoses:  Rash     Discharge Instructions      Stop by the pharmacy to pick up your prescriptions.  Follow up with your primary care provider or return to the urgent care, if not  improving.       ED Prescriptions     Medication Sig Dispense Auth. Provider   ivermectin  (STROMECTOL ) 3 MG TABS tablet Take 7 tablets (21,000 mcg total) by mouth every 14 (fourteen) days for 2 doses. 14 tablet Natallia Stellmach, DO   triamcinolone  ointment (KENALOG ) 0.5 % Apply 1 Application topically 2 (two) times daily. 30 g Ellington Cornia, DO   predniSONE  (STERAPRED UNI-PAK 21 TAB) 10 MG (21) TBPK tablet Take by mouth daily. Take 6 tabs by mouth daily for 1, then 5 tabs for 1 day, then 4 tabs for 1 day, then 3 tabs for 1 day, then 2 tabs for 1 day, then 1 tab for 1 day. 21 tablet Hamdi Kley, DO      PDMP not reviewed this encounter.              Alene Bergerson, DO 09/20/23 1044

## 2023-09-21 ENCOUNTER — Encounter: Payer: Self-pay | Admitting: Internal Medicine

## 2023-10-03 ENCOUNTER — Encounter: Payer: Self-pay | Admitting: Internal Medicine

## 2023-10-03 ENCOUNTER — Ambulatory Visit (INDEPENDENT_AMBULATORY_CARE_PROVIDER_SITE_OTHER): Admitting: Internal Medicine

## 2023-10-03 VITALS — BP 140/86 | HR 55 | Temp 97.7°F | Ht 70.0 in | Wt 221.2 lb

## 2023-10-03 DIAGNOSIS — Z1331 Encounter for screening for depression: Secondary | ICD-10-CM

## 2023-10-03 DIAGNOSIS — R7303 Prediabetes: Secondary | ICD-10-CM | POA: Diagnosis not present

## 2023-10-03 DIAGNOSIS — R030 Elevated blood-pressure reading, without diagnosis of hypertension: Secondary | ICD-10-CM

## 2023-10-03 NOTE — Progress Notes (Signed)
 New Patient Office Visit  Subjective:  Patient ID: Luke Caldwell, male    DOB: December 26, 1960  Age: 63 y.o. MRN: [id number]  Chief Complaint  Patient presents with   New Patient (Initial Visit)    [name] patient     No new complaints, here to establish IM care. BP elevated today but no prior h/o HTN. Last CPE in July this year.     No other concerns at this time.   Past Medical History:  Diagnosis Date   DVT (deep venous thrombosis) (HCC)    Edema     History reviewed. No pertinent surgical history.  Social History   Socioeconomic History   Marital status: Single    Spouse name: Not on file   Number of children: Not on file   Years of education: Not on file   Highest education level: Not on file  Occupational History   Not on file  Tobacco Use   Smoking status: Never   Smokeless tobacco: Never  Vaping Use   Vaping status: Never Used  Substance and Sexual Activity   Alcohol use: Yes    Comment: 1-2 beers/month   Drug use: No   Sexual activity: Not Currently  Other Topics Concern   Not on file  Social History Narrative   Not on file   Social Drivers of Health   Financial Resource Strain: Not on file  Food Insecurity: Not on file  Transportation Needs: Not on file  Physical Activity: Not on file  Stress: Not on file  Social Connections: Not on file  Intimate Partner Violence: Not on file    Family History  Problem Relation Age of Onset   Kidney disease Mother    Cancer Father     No Known Allergies  Outpatient Medications Prior to Visit  Medication Sig   ivermectin  (STROMECTOL ) 3 MG TABS tablet Take 7 tablets (21,000 mcg total) by mouth every 14 (fourteen) days for 2 doses.   Multiple Vitamins-Minerals (ONE DAILY MENS 50+ MULTIVIT PO) Take by mouth.   triamcinolone  ointment (KENALOG ) 0.5 % Apply 1 Application topically 2 (two) times daily.   [DISCONTINUED] aspirin 325 MG EC tablet Take 325 mg by mouth daily.   [DISCONTINUED] gabapentin  (NEURONTIN) 100 MG capsule Take by mouth.   [DISCONTINUED] ibuprofen (ADVIL,MOTRIN) 200 MG tablet Take 200 mg by mouth every 6 (six) hours as needed.   [DISCONTINUED] predniSONE  (STERAPRED UNI-PAK 21 TAB) 10 MG (21) TBPK tablet Take by mouth daily. Take 6 tabs by mouth daily for 1, then 5 tabs for 1 day, then 4 tabs for 1 day, then 3 tabs for 1 day, then 2 tabs for 1 day, then 1 tab for 1 day. (Patient not taking: Reported on 10/03/2023)   [DISCONTINUED] sulfamethoxazole -trimethoprim  (BACTRIM  DS,SEPTRA  DS) 800-160 MG tablet Take 1 tablet by mouth 2 (two) times daily. (Patient not taking: Reported on 08/25/2017)   No facility-administered medications prior to visit.    Review of Systems  Constitutional: Negative.   HENT: Negative.    Eyes: Negative.   Respiratory: Negative.    Cardiovascular: Negative.   Gastrointestinal: Negative.   Genitourinary: Negative.   Musculoskeletal:  Positive for joint pain (knees). Negative for back pain.  Skin: Negative.   Neurological: Negative.   Endo/Heme/Allergies: Negative.   Psychiatric/Behavioral: Negative.         Objective:   BP (!) 140/86 (Cuff Size: Normal)   Pulse (!) 55   Temp 97.7 F (36.5 C)   Ht 5' 10 (  1.778 m)   Wt 221 lb 3.2 oz (100.3 kg)   SpO2 95%   BMI 31.74 kg/m   Vitals:   10/03/23 1147 10/03/23 1210  BP: (!) 142/98 (!) 140/86  Pulse: (!) 55   Temp: 97.7 F (36.5 C)   Height: 5' 10 (1.778 m)   Weight: 221 lb 3.2 oz (100.3 kg)   SpO2: 95%   BMI (Calculated): 31.74     Physical Exam Vitals reviewed.  Constitutional:      Appearance: Normal appearance.  HENT:     Head: Normocephalic.     Left Ear: There is no impacted cerumen.     Nose: Nose normal.     Mouth/Throat:     Mouth: Mucous membranes are moist.     Pharynx: No posterior oropharyngeal erythema.  Eyes:     Extraocular Movements: Extraocular movements intact.     Pupils: Pupils are equal, round, and reactive to light.  Cardiovascular:     Rate and  Rhythm: Regular rhythm.     Chest Wall: PMI is not displaced.     Pulses: Normal pulses.     Heart sounds: Normal heart sounds. No murmur heard. Pulmonary:     Effort: Pulmonary effort is normal.     Breath sounds: Normal air entry. No rhonchi or rales.  Abdominal:     General: Abdomen is flat. Bowel sounds are normal. There is no distension.     Palpations: Abdomen is soft. There is no hepatomegaly, splenomegaly or mass.     Tenderness: There is no abdominal tenderness.  Musculoskeletal:        General: Normal range of motion.     Cervical back: Normal range of motion and neck supple.     Right lower leg: No edema.     Left lower leg: No edema.  Skin:    General: Skin is warm and dry.  Neurological:     General: No focal deficit present.     Mental Status: He is alert and oriented to person, place, and time.     Cranial Nerves: No cranial nerve deficit.     Motor: No weakness.  Psychiatric:        Mood and Affect: Mood normal.        Behavior: Behavior normal.      No results found for any visits on 10/03/23.  No results found for this or any previous visit (from the past 2160 hours).    Assessment & Plan:  As per problem list Record bp at home and re-eval with readings in 4 wks. Problem List Items Addressed This Visit       Other   Prediabetes - Primary    Problem List Items Addressed This Visit       Other   Prediabetes - Primary    Return in about 4 weeks (around 10/31/2023) for BP followup.   Total time spent: 30 minutes  Sherrill Cinderella Perry, MD  10/03/2023   This document may have been prepared by [facility] Voice Recognition software and as such may include unintentional dictation errors.

## 2023-11-07 ENCOUNTER — Ambulatory Visit (INDEPENDENT_AMBULATORY_CARE_PROVIDER_SITE_OTHER): Admitting: Internal Medicine

## 2023-11-07 VITALS — BP 130/84 | HR 57 | Temp 97.8°F | Ht 70.0 in | Wt 224.4 lb

## 2023-11-07 DIAGNOSIS — R7303 Prediabetes: Secondary | ICD-10-CM

## 2023-11-07 DIAGNOSIS — R030 Elevated blood-pressure reading, without diagnosis of hypertension: Secondary | ICD-10-CM | POA: Diagnosis not present

## 2023-11-07 NOTE — Progress Notes (Signed)
 Established Patient Office Visit  Subjective:  Patient ID: Luke Caldwell, male    DOB: April 24, 1960  Age: 63 y.o. MRN: [id number]  Chief Complaint  Patient presents with   Follow-up    4 week follow up    No new complaints, bp much better today and states his readings at home were around 124/80 although he forgot to bring his recorded readings.     No other concerns at this time.   Past Medical History:  Diagnosis Date   DVT (deep venous thrombosis) (HCC)    Edema     No past surgical history on file.  Social History   Socioeconomic History   Marital status: Single    Spouse name: Not on file   Number of children: Not on file   Years of education: Not on file   Highest education level: Not on file  Occupational History   Not on file  Tobacco Use   Smoking status: Never   Smokeless tobacco: Never  Vaping Use   Vaping status: Never Used  Substance and Sexual Activity   Alcohol use: Yes    Comment: 1-2 beers/month   Drug use: No   Sexual activity: Not Currently  Other Topics Concern   Not on file  Social History Narrative   Not on file   Social Drivers of Health   Financial Resource Strain: Not on file  Food Insecurity: Not on file  Transportation Needs: Not on file  Physical Activity: Not on file  Stress: Not on file  Social Connections: Not on file  Intimate Partner Violence: Not on file    Family History  Problem Relation Age of Onset   Kidney disease Mother    Cancer Father     No Known Allergies  Outpatient Medications Prior to Visit  Medication Sig   Multiple Vitamins-Minerals (ONE DAILY MENS 50+ MULTIVIT PO) Take by mouth.   triamcinolone  ointment (KENALOG ) 0.5 % Apply 1 Application topically 2 (two) times daily. (Patient not taking: Reported on 11/07/2023)   No facility-administered medications prior to visit.    Review of Systems  Constitutional: Negative.  Negative for weight loss (gained 3 lbs).  HENT: Negative.    Eyes:  Negative.   Respiratory: Negative.    Cardiovascular: Negative.   Gastrointestinal: Negative.   Genitourinary: Negative.   Musculoskeletal:  Positive for joint pain (knees). Negative for back pain.  Skin: Negative.   Neurological: Negative.   Endo/Heme/Allergies: Negative.   Psychiatric/Behavioral: Negative.         Objective:   BP 130/84   Pulse (!) 57   Temp 97.8 F (36.6 C)   Ht 5' 10 (1.778 m)   Wt 224 lb 6.4 oz (101.8 kg)   SpO2 98%   BMI 32.20 kg/m   Vitals:   11/07/23 1118  BP: 130/84  Pulse: (!) 57  Temp: 97.8 F (36.6 C)  Height: 5' 10 (1.778 m)  Weight: 224 lb 6.4 oz (101.8 kg)  SpO2: 98%  BMI (Calculated): 32.2    Physical Exam Vitals reviewed.  Constitutional:      Appearance: Normal appearance.  HENT:     Head: Normocephalic.     Left Ear: There is no impacted cerumen.     Nose: Nose normal.     Mouth/Throat:     Mouth: Mucous membranes are moist.     Pharynx: No posterior oropharyngeal erythema.  Eyes:     Extraocular Movements: Extraocular movements intact.     Pupils: Pupils  are equal, round, and reactive to light.  Cardiovascular:     Rate and Rhythm: Regular rhythm.     Chest Wall: PMI is not displaced.     Pulses: Normal pulses.     Heart sounds: Normal heart sounds. No murmur heard. Pulmonary:     Effort: Pulmonary effort is normal.     Breath sounds: Normal air entry. No rhonchi or rales.  Abdominal:     General: Abdomen is flat. Bowel sounds are normal. There is no distension.     Palpations: Abdomen is soft. There is no hepatomegaly, splenomegaly or mass.     Tenderness: There is no abdominal tenderness.  Musculoskeletal:        General: Normal range of motion.     Cervical back: Normal range of motion and neck supple.     Right lower leg: No edema.     Left lower leg: No edema.  Skin:    General: Skin is warm and dry.  Neurological:     General: No focal deficit present.     Mental Status: He is alert and oriented to  person, place, and time.     Cranial Nerves: No cranial nerve deficit.     Motor: No weakness.  Psychiatric:        Mood and Affect: Mood normal.        Behavior: Behavior normal.      No results found for any visits on 11/07/23.  No results found for this or any previous visit (from the past 2160 hours).    Assessment & Plan:  As per problem list. Stricter low calorie diet, low cholesterol and low fat diet and exercise as much as possible  Problem List Items Addressed This Visit       Other   Prediabetes - Primary   Relevant Orders   Hemoglobin A1c   Comprehensive metabolic panel with GFR   Other Visit Diagnoses       Prehypertension           Return in about 4 months (around 03/08/2024) for fu with labs prior.   Total time spent: 20 minutes  Sherrill Cinderella Perry, MD  11/07/2023   This document may have been prepared by [facility] Voice Recognition software and as such may include unintentional dictation errors.

## 2024-03-12 ENCOUNTER — Ambulatory Visit: Admitting: Internal Medicine

## 2024-03-13 ENCOUNTER — Ambulatory Visit: Admitting: Internal Medicine

## 2024-03-13 ENCOUNTER — Encounter: Payer: Self-pay | Admitting: Internal Medicine

## 2024-03-13 VITALS — BP 118/82 | HR 60 | Ht 70.0 in | Wt 233.2 lb

## 2024-03-13 DIAGNOSIS — R030 Elevated blood-pressure reading, without diagnosis of hypertension: Secondary | ICD-10-CM

## 2024-03-13 DIAGNOSIS — Z0130 Encounter for examination of blood pressure without abnormal findings: Secondary | ICD-10-CM

## 2024-03-13 DIAGNOSIS — M25561 Pain in right knee: Secondary | ICD-10-CM

## 2024-03-13 DIAGNOSIS — R7303 Prediabetes: Secondary | ICD-10-CM

## 2024-03-13 NOTE — Progress Notes (Signed)
 "  Established Patient Office Visit  Subjective:  Patient ID: Luke Caldwell, male    DOB: 08/15/1960  Age: 64 y.o. MRN: [id number]  Chief Complaint  Patient presents with   Follow-up    4 month follow up    No new complaints except worsening pain and swelling of his right knee. Also here for lab review and medication refills. Failed to have previsit labs done.     No other concerns at this time.   Past Medical History:  Diagnosis Date   DVT (deep venous thrombosis) (HCC)    Edema     No past surgical history on file.  Social History   Socioeconomic History   Marital status: Single    Spouse name: Not on file   Number of children: Not on file   Years of education: Not on file   Highest education level: Not on file  Occupational History   Not on file  Tobacco Use   Smoking status: Never   Smokeless tobacco: Never  Vaping Use   Vaping status: Never Used  Substance and Sexual Activity   Alcohol use: Yes    Comment: 1-2 beers/month   Drug use: No   Sexual activity: Not Currently  Other Topics Concern   Not on file  Social History Narrative   Not on file   Social Drivers of Health   Tobacco Use: Low Risk (03/13/2024)   Patient History    Smoking Tobacco Use: Never    Smokeless Tobacco Use: Never    Passive Exposure: Not on file  Financial Resource Strain: Not on file  Food Insecurity: Not on file  Transportation Needs: Not on file  Physical Activity: Not on file  Stress: Not on file  Social Connections: Not on file  Intimate Partner Violence: Not on file  Depression (PHQ2-9): Low Risk (10/03/2023)   Depression (PHQ2-9)    PHQ-2 Score: 0  Alcohol Screen: Not on file  Housing: Not on file  Utilities: Not on file  Health Literacy: Not on file    Family History  Problem Relation Age of Onset   Kidney disease Mother    Cancer Father     Allergies[1]  Show/hide medication list[2]  Review of Systems  Constitutional: Negative.   Negative for weight loss (gained 7 lbs).  HENT: Negative.    Eyes: Negative.   Respiratory: Negative.    Cardiovascular: Negative.   Gastrointestinal: Negative.   Genitourinary: Negative.   Musculoskeletal:  Positive for joint pain (knees). Negative for back pain.  Skin: Negative.   Neurological: Negative.   Endo/Heme/Allergies: Negative.   Psychiatric/Behavioral: Negative.         Objective:   BP 118/82   Pulse 60   Ht 5' 10 (1.778 m)   Wt 233 lb 3.2 oz (105.8 kg)   SpO2 99%   BMI 33.46 kg/m   Vitals:   03/13/24 1117  BP: 118/82  Pulse: 60  Height: 5' 10 (1.778 m)  Weight: 233 lb 3.2 oz (105.8 kg)  SpO2: 99%  BMI (Calculated): 33.46    Physical Exam Vitals reviewed.  Constitutional:      Appearance: Normal appearance.  HENT:     Head: Normocephalic.     Left Ear: There is no impacted cerumen.     Nose: Nose normal.     Mouth/Throat:     Mouth: Mucous membranes are moist.     Pharynx: No posterior oropharyngeal erythema.  Eyes:     Extraocular Movements: Extraocular movements  intact.     Pupils: Pupils are equal, round, and reactive to light.  Cardiovascular:     Rate and Rhythm: Regular rhythm.     Chest Wall: PMI is not displaced.     Pulses: Normal pulses.     Heart sounds: Normal heart sounds. No murmur heard. Pulmonary:     Effort: Pulmonary effort is normal.     Breath sounds: Normal air entry. No rhonchi or rales.  Abdominal:     General: Abdomen is flat. Bowel sounds are normal. There is no distension.     Palpations: Abdomen is soft. There is no hepatomegaly, splenomegaly or mass.     Tenderness: There is no abdominal tenderness.  Musculoskeletal:        General: Normal range of motion.     Cervical back: Normal range of motion and neck supple.     Right lower leg: No edema.     Left lower leg: No edema.  Skin:    General: Skin is warm and dry.  Neurological:     General: No focal deficit present.     Mental Status: He is alert and  oriented to person, place, and time.     Cranial Nerves: No cranial nerve deficit.     Motor: No weakness.  Psychiatric:        Mood and Affect: Mood normal.        Behavior: Behavior normal.      No results found for any visits on 03/13/24.  No results found for this or any previous visit (from the past 2160 hours).    Assessment & Plan:  Elya was seen today for follow-up.  Prediabetes -     Hemoglobin A1c  Prehypertension  Arthralgia of right knee -     Ambulatory referral to Orthopedics    Problem List Items Addressed This Visit       Other   Prediabetes - Primary   Relevant Orders   Hemoglobin A1c   Other Visit Diagnoses       Prehypertension         Arthralgia of right knee       Relevant Orders   Ambulatory referral to Orthopedics       Return in about 4 months (around 07/11/2024) for BP followup, fu with labs prior.   Total time spent: 20 minutes. This time includes review of previous notes and results and patient face to face interaction during today'Farmer Mccahill visit.    Sherrill Cinderella Perry, MD  03/13/2024   This document may have been prepared by [facility] Voice Recognition software and as such may include unintentional dictation errors.       [1] No Known Allergies [2] Outpatient Medications Prior to Visit  Medication Sig   BERBERINE CHLORIDE PO Take by mouth.   Multiple Vitamins-Minerals (ONE DAILY MENS 50+ MULTIVIT PO) Take by mouth.   triamcinolone  ointment (KENALOG ) 0.5 % Apply 1 Application topically 2 (two) times daily. (Patient not taking: Reported on 03/13/2024)   No facility-administered medications prior to visit.  "

## 2024-03-14 LAB — COMPREHENSIVE METABOLIC PANEL WITH GFR
ALT: 27 [IU]/L (ref 0–44)
AST: 20 [IU]/L (ref 0–40)
Albumin: 4.5 g/dL (ref 3.9–4.9)
Alkaline Phosphatase: 66 [IU]/L (ref 47–123)
BUN/Creatinine Ratio: 13 (ref 10–24)
BUN: 14 mg/dL (ref 8–27)
Bilirubin Total: 0.8 mg/dL (ref 0.0–1.2)
CO2: 22 mmol/L (ref 20–29)
Calcium: 9.3 mg/dL (ref 8.6–10.2)
Chloride: 101 mmol/L (ref 96–106)
Creatinine, Ser: 1.07 mg/dL (ref 0.76–1.27)
Globulin, Total: 2.7 g/dL (ref 1.5–4.5)
Glucose: 89 mg/dL (ref 70–99)
Potassium: 4.5 mmol/L (ref 3.5–5.2)
Sodium: 139 mmol/L (ref 134–144)
Total Protein: 7.2 g/dL (ref 6.0–8.5)
eGFR: 77 mL/min/{1.73_m2}

## 2024-03-14 LAB — HEMOGLOBIN A1C
Est. average glucose Bld gHb Est-mCnc: 137 mg/dL
Hgb A1c MFr Bld: 6.4 % — ABNORMAL HIGH (ref 4.8–5.6)

## 2024-07-12 ENCOUNTER — Ambulatory Visit: Admitting: Internal Medicine
